# Patient Record
Sex: Male | Born: 1935
Health system: Southern US, Community
[De-identification: ages and names within clinical notes are randomized; demographics above are authoritative.]

---

## 2009-06-14 ENCOUNTER — Encounter: Admission: RE | Admit: 2009-06-14 | Discharge: 2009-06-14 | Payer: Self-pay | Admitting: Unknown Physician Specialty

## 2010-10-21 IMAGING — CR DG KNEE COMPLETE 4+V*L*
4 series · 4 of 4 positions shown · non-contrast
Comparison: None.

CLINICAL DATA: Recent fall with left knee pain.

LEFT KNEE - COMPLETE 4+ VIEW

[view not recorded (1 of 4)]
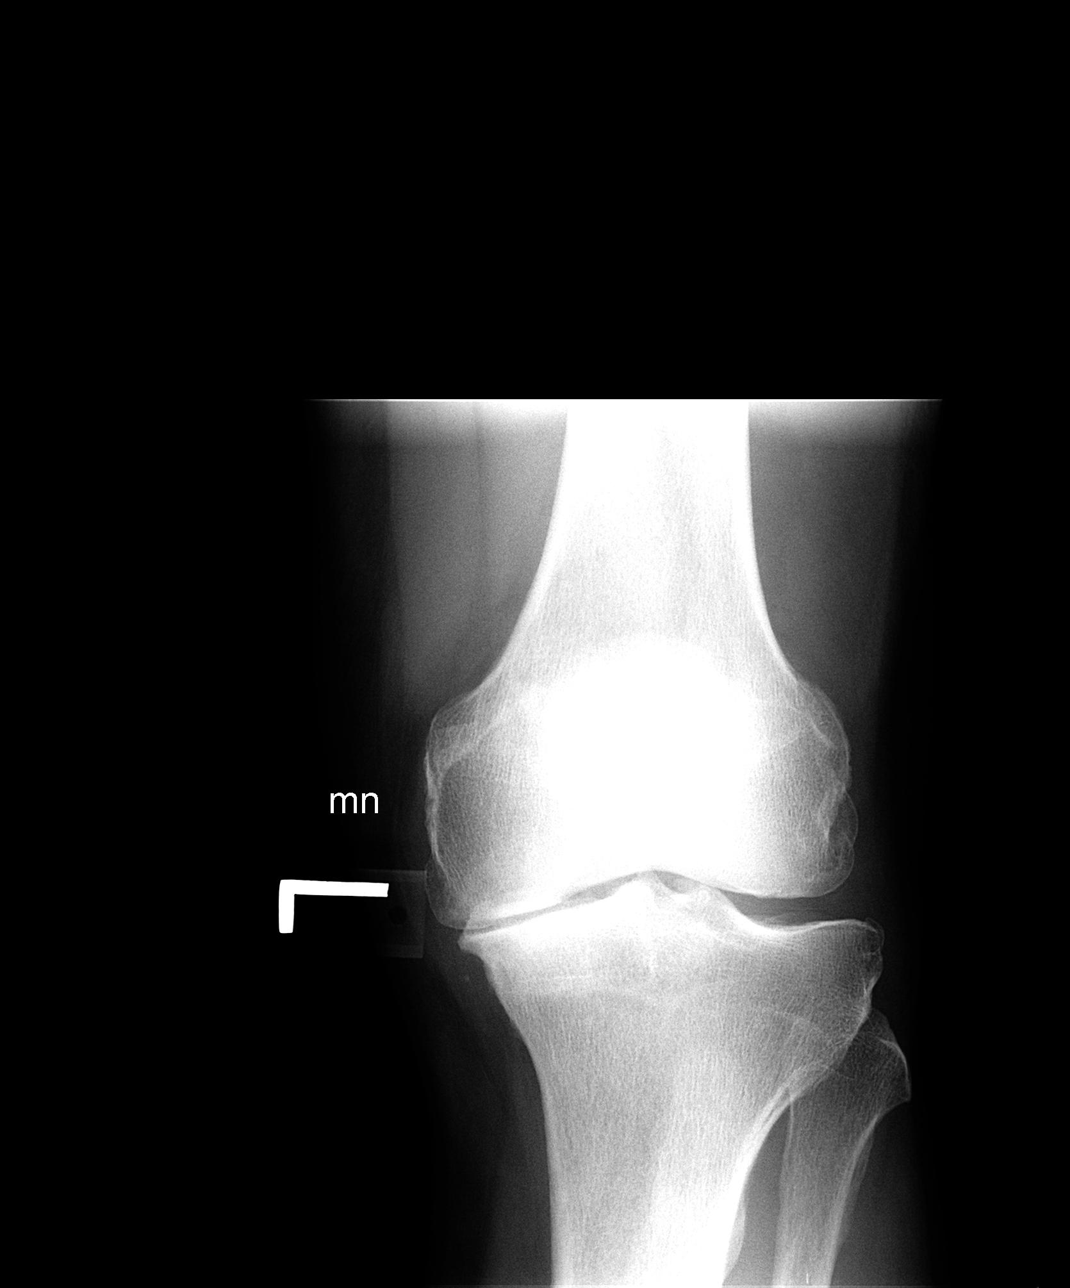

[view not recorded (2 of 4)]
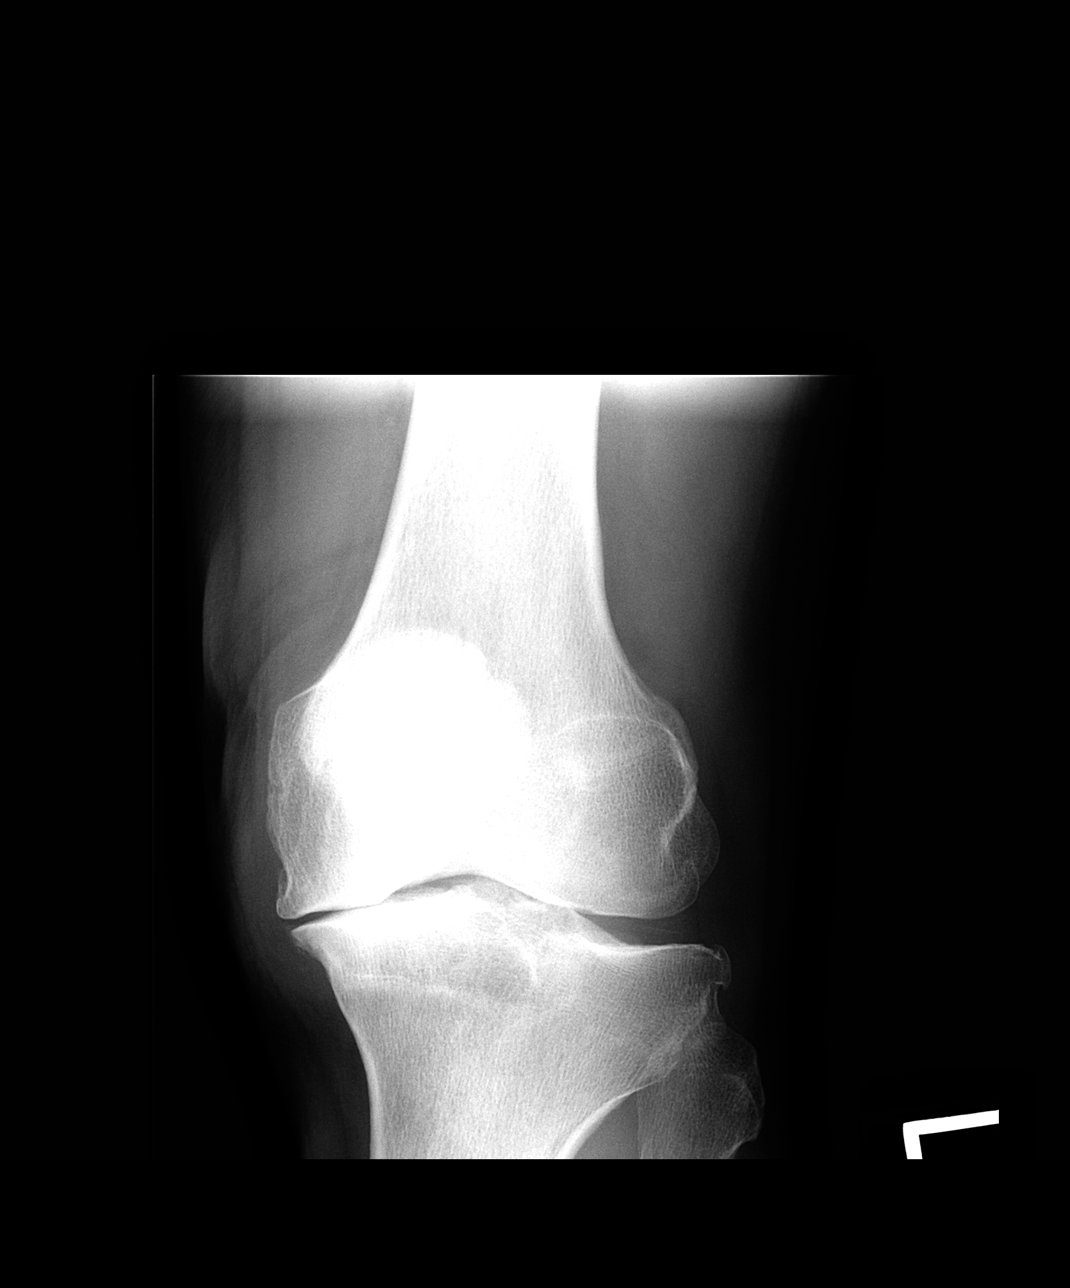

[view not recorded (3 of 4)]
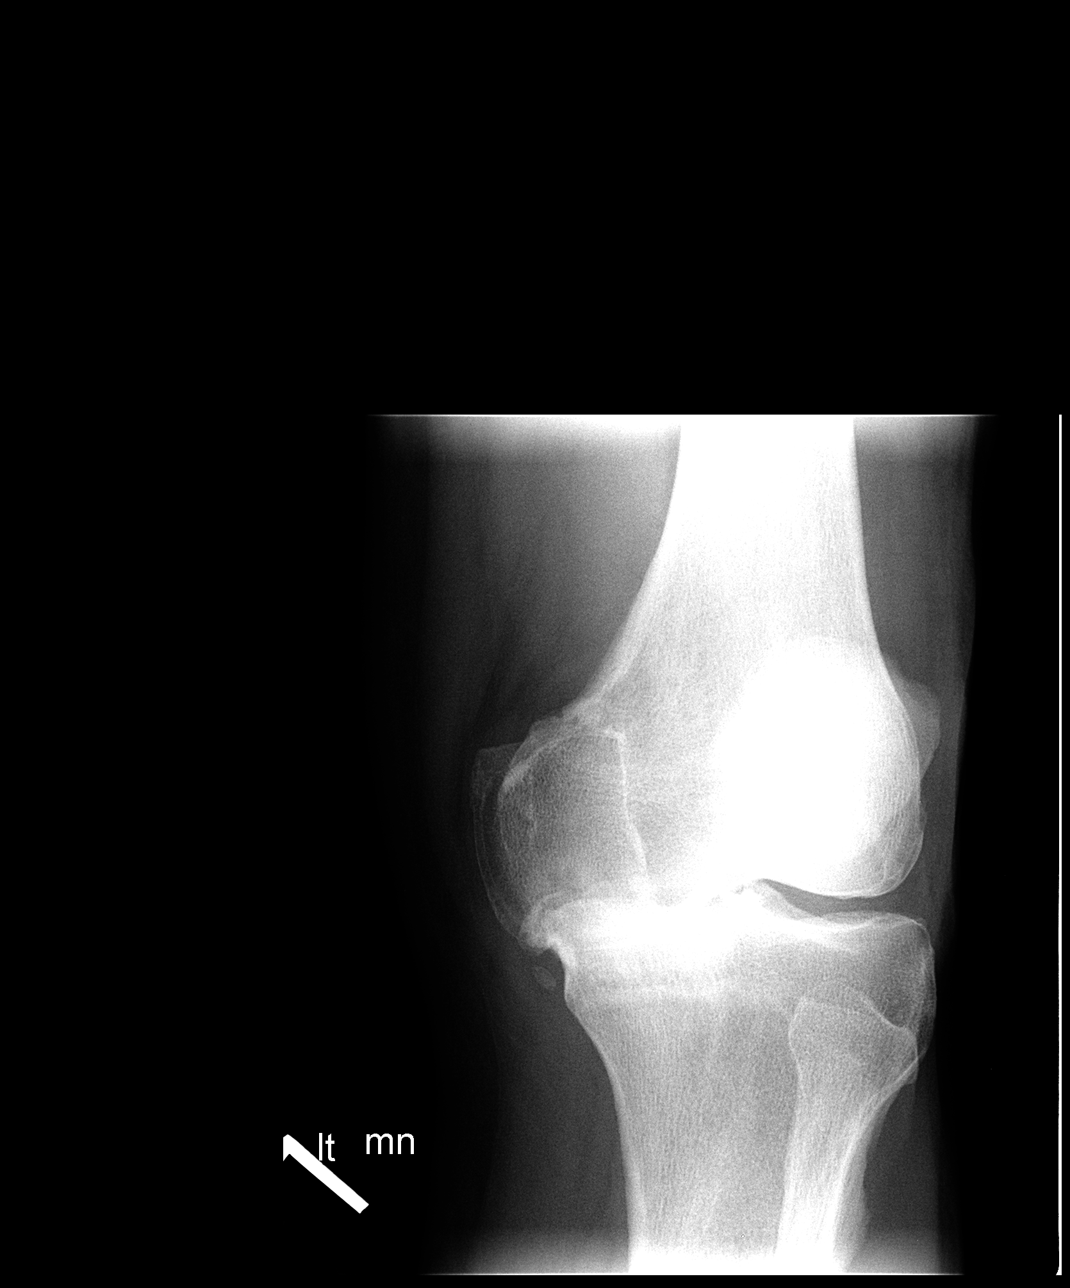

[view not recorded (4 of 4)]
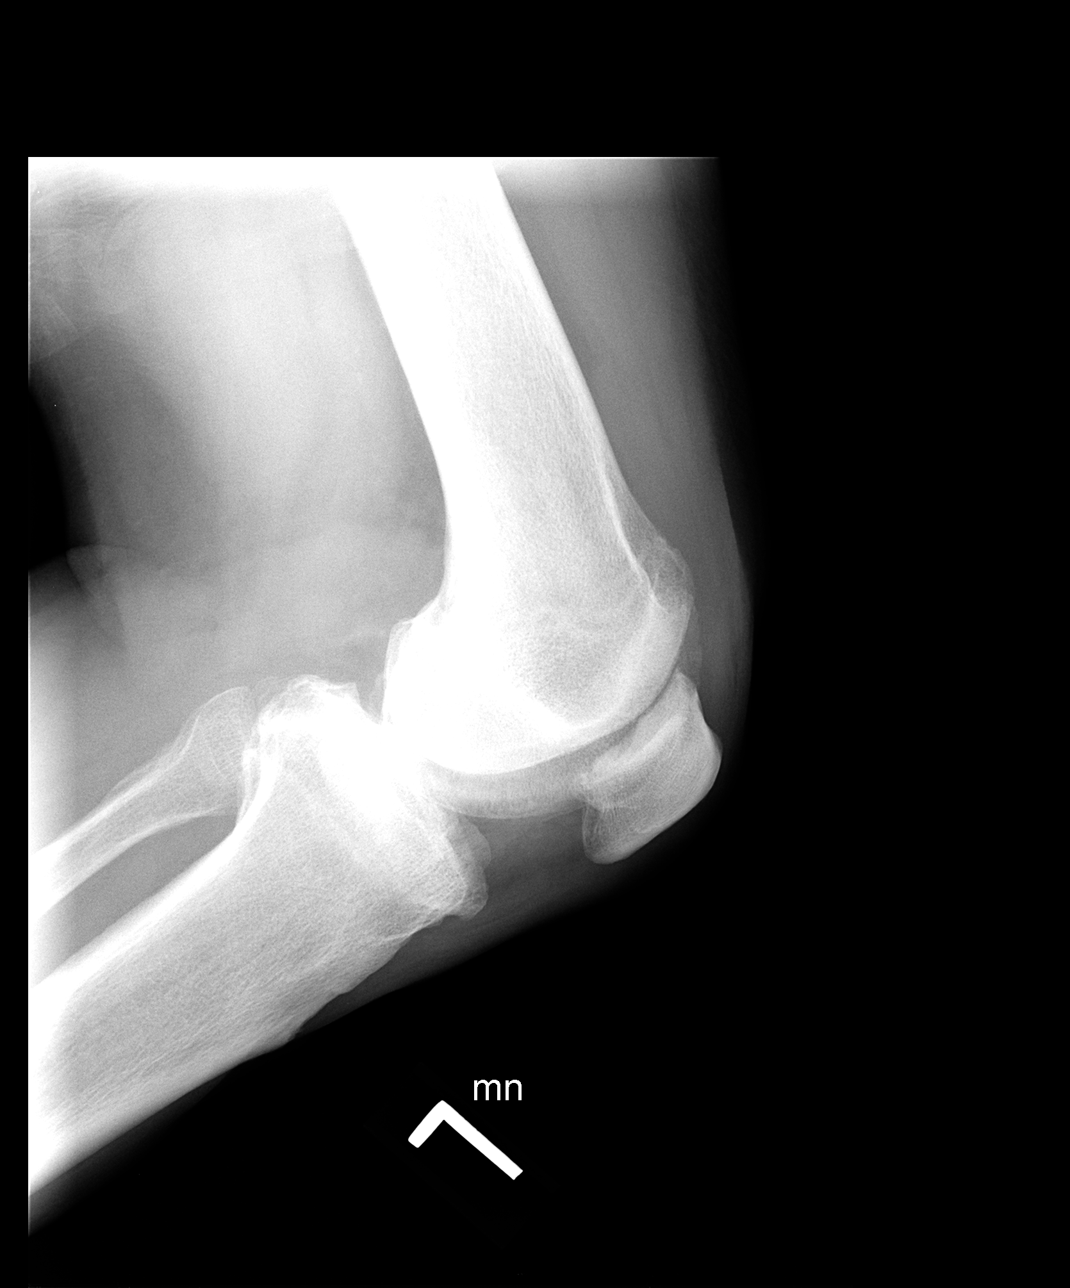

[4 of 4 positions shown; findings below may reference images not displayed]

FINDINGS: Difficult to exclude a small joint effusion.  No
fracture.  Marked medial joint space narrowing and subchondral
sclerosis.  Tricompartment osteophytosis. Chondrocalcinosis is seen
in the lateral compartment.  There is mild lateral subluxation of
the tibia, with respect to the distal femur.
IMPRESSION: 1.  Cannot exclude a small joint effusion.  No fracture.
2.  Osteoarthritis is worst in the medial compartment.

## 2015-04-11 DIAGNOSIS — R972 Elevated prostate specific antigen [PSA]: Secondary | ICD-10-CM | POA: Diagnosis not present

## 2015-04-13 DIAGNOSIS — I7300 Raynaud's syndrome without gangrene: Secondary | ICD-10-CM | POA: Diagnosis not present

## 2015-04-13 DIAGNOSIS — E7800 Pure hypercholesterolemia, unspecified: Secondary | ICD-10-CM | POA: Diagnosis not present

## 2015-04-13 DIAGNOSIS — K219 Gastro-esophageal reflux disease without esophagitis: Secondary | ICD-10-CM | POA: Diagnosis not present

## 2015-04-13 DIAGNOSIS — B0229 Other postherpetic nervous system involvement: Secondary | ICD-10-CM | POA: Diagnosis not present

## 2015-04-13 DIAGNOSIS — M170 Bilateral primary osteoarthritis of knee: Secondary | ICD-10-CM | POA: Diagnosis not present

## 2015-04-13 DIAGNOSIS — I10 Essential (primary) hypertension: Secondary | ICD-10-CM | POA: Diagnosis not present

## 2015-04-19 DIAGNOSIS — R972 Elevated prostate specific antigen [PSA]: Secondary | ICD-10-CM | POA: Diagnosis not present

## 2015-04-19 DIAGNOSIS — Z87448 Personal history of other diseases of urinary system: Secondary | ICD-10-CM | POA: Diagnosis not present

## 2015-04-19 DIAGNOSIS — C9110 Chronic lymphocytic leukemia of B-cell type not having achieved remission: Secondary | ICD-10-CM | POA: Diagnosis not present

## 2015-05-24 DIAGNOSIS — R1084 Generalized abdominal pain: Secondary | ICD-10-CM | POA: Diagnosis not present

## 2015-05-24 DIAGNOSIS — K219 Gastro-esophageal reflux disease without esophagitis: Secondary | ICD-10-CM | POA: Diagnosis not present

## 2015-05-24 DIAGNOSIS — R112 Nausea with vomiting, unspecified: Secondary | ICD-10-CM | POA: Diagnosis not present

## 2015-05-24 DIAGNOSIS — R1013 Epigastric pain: Secondary | ICD-10-CM | POA: Diagnosis not present

## 2015-05-24 DIAGNOSIS — E739 Lactose intolerance, unspecified: Secondary | ICD-10-CM | POA: Diagnosis not present

## 2015-06-15 DIAGNOSIS — H9190 Unspecified hearing loss, unspecified ear: Secondary | ICD-10-CM | POA: Diagnosis not present

## 2015-06-15 DIAGNOSIS — H9319 Tinnitus, unspecified ear: Secondary | ICD-10-CM | POA: Diagnosis not present

## 2015-06-15 DIAGNOSIS — R972 Elevated prostate specific antigen [PSA]: Secondary | ICD-10-CM | POA: Diagnosis not present

## 2015-06-15 DIAGNOSIS — C9110 Chronic lymphocytic leukemia of B-cell type not having achieved remission: Secondary | ICD-10-CM | POA: Diagnosis not present

## 2015-08-16 DIAGNOSIS — E739 Lactose intolerance, unspecified: Secondary | ICD-10-CM | POA: Diagnosis not present

## 2015-08-16 DIAGNOSIS — K219 Gastro-esophageal reflux disease without esophagitis: Secondary | ICD-10-CM | POA: Diagnosis not present

## 2015-08-16 DIAGNOSIS — R194 Change in bowel habit: Secondary | ICD-10-CM | POA: Diagnosis not present

## 2015-08-16 DIAGNOSIS — K589 Irritable bowel syndrome without diarrhea: Secondary | ICD-10-CM | POA: Diagnosis not present

## 2015-10-18 DIAGNOSIS — E7800 Pure hypercholesterolemia, unspecified: Secondary | ICD-10-CM | POA: Diagnosis not present

## 2015-10-18 DIAGNOSIS — Z0000 Encounter for general adult medical examination without abnormal findings: Secondary | ICD-10-CM | POA: Diagnosis not present

## 2015-10-18 DIAGNOSIS — Z23 Encounter for immunization: Secondary | ICD-10-CM | POA: Diagnosis not present

## 2015-10-18 DIAGNOSIS — I10 Essential (primary) hypertension: Secondary | ICD-10-CM | POA: Diagnosis not present

## 2015-10-18 DIAGNOSIS — C9110 Chronic lymphocytic leukemia of B-cell type not having achieved remission: Secondary | ICD-10-CM | POA: Diagnosis not present

## 2015-10-18 DIAGNOSIS — M170 Bilateral primary osteoarthritis of knee: Secondary | ICD-10-CM | POA: Diagnosis not present

## 2015-11-30 DIAGNOSIS — Z23 Encounter for immunization: Secondary | ICD-10-CM | POA: Diagnosis not present

## 2016-03-15 DIAGNOSIS — I10 Essential (primary) hypertension: Secondary | ICD-10-CM | POA: Diagnosis not present

## 2016-03-15 DIAGNOSIS — R972 Elevated prostate specific antigen [PSA]: Secondary | ICD-10-CM | POA: Diagnosis not present

## 2016-03-15 DIAGNOSIS — N400 Enlarged prostate without lower urinary tract symptoms: Secondary | ICD-10-CM | POA: Diagnosis not present

## 2016-03-15 DIAGNOSIS — C9110 Chronic lymphocytic leukemia of B-cell type not having achieved remission: Secondary | ICD-10-CM | POA: Diagnosis not present

## 2016-03-15 DIAGNOSIS — C9190 Lymphoid leukemia, unspecified not having achieved remission: Secondary | ICD-10-CM | POA: Diagnosis not present

## 2016-03-15 DIAGNOSIS — K219 Gastro-esophageal reflux disease without esophagitis: Secondary | ICD-10-CM | POA: Diagnosis not present

## 2016-05-27 DIAGNOSIS — I10 Essential (primary) hypertension: Secondary | ICD-10-CM | POA: Diagnosis not present

## 2016-05-27 DIAGNOSIS — E7800 Pure hypercholesterolemia, unspecified: Secondary | ICD-10-CM | POA: Diagnosis not present

## 2016-05-27 DIAGNOSIS — K219 Gastro-esophageal reflux disease without esophagitis: Secondary | ICD-10-CM | POA: Diagnosis not present

## 2016-05-27 DIAGNOSIS — N400 Enlarged prostate without lower urinary tract symptoms: Secondary | ICD-10-CM | POA: Diagnosis not present

## 2016-06-06 DIAGNOSIS — Z856 Personal history of leukemia: Secondary | ICD-10-CM | POA: Diagnosis not present

## 2016-06-06 DIAGNOSIS — Z885 Allergy status to narcotic agent status: Secondary | ICD-10-CM | POA: Diagnosis not present

## 2016-06-06 DIAGNOSIS — K6289 Other specified diseases of anus and rectum: Secondary | ICD-10-CM | POA: Diagnosis not present

## 2016-06-06 DIAGNOSIS — Z79899 Other long term (current) drug therapy: Secondary | ICD-10-CM | POA: Diagnosis not present

## 2016-06-06 DIAGNOSIS — R1084 Generalized abdominal pain: Secondary | ICD-10-CM | POA: Diagnosis not present

## 2016-06-06 DIAGNOSIS — R55 Syncope and collapse: Secondary | ICD-10-CM | POA: Diagnosis not present

## 2016-06-06 DIAGNOSIS — R197 Diarrhea, unspecified: Secondary | ICD-10-CM | POA: Diagnosis not present

## 2016-06-06 DIAGNOSIS — Z87891 Personal history of nicotine dependence: Secondary | ICD-10-CM | POA: Diagnosis not present

## 2016-06-06 DIAGNOSIS — N400 Enlarged prostate without lower urinary tract symptoms: Secondary | ICD-10-CM | POA: Diagnosis not present

## 2016-08-07 DIAGNOSIS — K219 Gastro-esophageal reflux disease without esophagitis: Secondary | ICD-10-CM | POA: Diagnosis not present

## 2016-08-07 DIAGNOSIS — I10 Essential (primary) hypertension: Secondary | ICD-10-CM | POA: Diagnosis not present

## 2016-09-30 DIAGNOSIS — I10 Essential (primary) hypertension: Secondary | ICD-10-CM | POA: Diagnosis not present

## 2016-09-30 DIAGNOSIS — N401 Enlarged prostate with lower urinary tract symptoms: Secondary | ICD-10-CM | POA: Diagnosis not present

## 2016-09-30 DIAGNOSIS — C9190 Lymphoid leukemia, unspecified not having achieved remission: Secondary | ICD-10-CM | POA: Diagnosis not present

## 2016-09-30 DIAGNOSIS — R972 Elevated prostate specific antigen [PSA]: Secondary | ICD-10-CM | POA: Diagnosis not present

## 2016-10-21 DIAGNOSIS — C9110 Chronic lymphocytic leukemia of B-cell type not having achieved remission: Secondary | ICD-10-CM | POA: Diagnosis not present

## 2016-10-21 DIAGNOSIS — M170 Bilateral primary osteoarthritis of knee: Secondary | ICD-10-CM | POA: Diagnosis not present

## 2016-10-21 DIAGNOSIS — I700 Atherosclerosis of aorta: Secondary | ICD-10-CM | POA: Diagnosis not present

## 2016-10-21 DIAGNOSIS — E7800 Pure hypercholesterolemia, unspecified: Secondary | ICD-10-CM | POA: Diagnosis not present

## 2016-10-21 DIAGNOSIS — I10 Essential (primary) hypertension: Secondary | ICD-10-CM | POA: Diagnosis not present

## 2016-10-21 DIAGNOSIS — Z0000 Encounter for general adult medical examination without abnormal findings: Secondary | ICD-10-CM | POA: Diagnosis not present

## 2016-12-05 DIAGNOSIS — K219 Gastro-esophageal reflux disease without esophagitis: Secondary | ICD-10-CM | POA: Diagnosis not present

## 2016-12-05 DIAGNOSIS — Z9189 Other specified personal risk factors, not elsewhere classified: Secondary | ICD-10-CM | POA: Diagnosis not present

## 2016-12-05 DIAGNOSIS — I10 Essential (primary) hypertension: Secondary | ICD-10-CM | POA: Diagnosis not present

## 2016-12-05 DIAGNOSIS — L989 Disorder of the skin and subcutaneous tissue, unspecified: Secondary | ICD-10-CM | POA: Diagnosis not present

## 2016-12-05 DIAGNOSIS — C9190 Lymphoid leukemia, unspecified not having achieved remission: Secondary | ICD-10-CM | POA: Diagnosis not present

## 2016-12-31 DIAGNOSIS — Z23 Encounter for immunization: Secondary | ICD-10-CM | POA: Diagnosis not present

## 2017-01-22 DIAGNOSIS — L821 Other seborrheic keratosis: Secondary | ICD-10-CM | POA: Diagnosis not present

## 2017-04-30 DIAGNOSIS — M170 Bilateral primary osteoarthritis of knee: Secondary | ICD-10-CM | POA: Diagnosis not present

## 2017-04-30 DIAGNOSIS — K219 Gastro-esophageal reflux disease without esophagitis: Secondary | ICD-10-CM | POA: Diagnosis not present

## 2017-04-30 DIAGNOSIS — E7800 Pure hypercholesterolemia, unspecified: Secondary | ICD-10-CM | POA: Diagnosis not present

## 2017-04-30 DIAGNOSIS — I10 Essential (primary) hypertension: Secondary | ICD-10-CM | POA: Diagnosis not present

## 2017-06-12 DIAGNOSIS — M2550 Pain in unspecified joint: Secondary | ICD-10-CM | POA: Diagnosis not present

## 2017-06-12 DIAGNOSIS — I10 Essential (primary) hypertension: Secondary | ICD-10-CM | POA: Diagnosis not present

## 2017-06-12 DIAGNOSIS — R531 Weakness: Secondary | ICD-10-CM | POA: Diagnosis not present

## 2017-06-12 DIAGNOSIS — H9319 Tinnitus, unspecified ear: Secondary | ICD-10-CM | POA: Diagnosis not present

## 2017-06-12 DIAGNOSIS — C9110 Chronic lymphocytic leukemia of B-cell type not having achieved remission: Secondary | ICD-10-CM | POA: Diagnosis not present

## 2017-06-12 DIAGNOSIS — K219 Gastro-esophageal reflux disease without esophagitis: Secondary | ICD-10-CM | POA: Diagnosis not present

## 2017-06-12 DIAGNOSIS — D849 Immunodeficiency, unspecified: Secondary | ICD-10-CM | POA: Diagnosis not present

## 2017-06-12 DIAGNOSIS — Z9189 Other specified personal risk factors, not elsewhere classified: Secondary | ICD-10-CM | POA: Diagnosis not present

## 2017-06-12 DIAGNOSIS — C9190 Lymphoid leukemia, unspecified not having achieved remission: Secondary | ICD-10-CM | POA: Diagnosis not present

## 2017-08-01 DIAGNOSIS — R490 Dysphonia: Secondary | ICD-10-CM | POA: Diagnosis not present

## 2017-08-01 DIAGNOSIS — R070 Pain in throat: Secondary | ICD-10-CM | POA: Diagnosis not present

## 2017-08-01 DIAGNOSIS — J705 Respiratory conditions due to smoke inhalation: Secondary | ICD-10-CM | POA: Diagnosis not present

## 2017-08-06 DIAGNOSIS — I10 Essential (primary) hypertension: Secondary | ICD-10-CM | POA: Diagnosis not present

## 2017-08-06 DIAGNOSIS — K219 Gastro-esophageal reflux disease without esophagitis: Secondary | ICD-10-CM | POA: Diagnosis not present

## 2017-11-11 DIAGNOSIS — Z23 Encounter for immunization: Secondary | ICD-10-CM | POA: Diagnosis not present

## 2017-11-18 DIAGNOSIS — I10 Essential (primary) hypertension: Secondary | ICD-10-CM | POA: Diagnosis not present

## 2017-11-25 DIAGNOSIS — M170 Bilateral primary osteoarthritis of knee: Secondary | ICD-10-CM | POA: Diagnosis not present

## 2017-11-25 DIAGNOSIS — E7800 Pure hypercholesterolemia, unspecified: Secondary | ICD-10-CM | POA: Diagnosis not present

## 2017-11-25 DIAGNOSIS — K219 Gastro-esophageal reflux disease without esophagitis: Secondary | ICD-10-CM | POA: Diagnosis not present

## 2017-11-25 DIAGNOSIS — Z0000 Encounter for general adult medical examination without abnormal findings: Secondary | ICD-10-CM | POA: Diagnosis not present

## 2017-11-25 DIAGNOSIS — I10 Essential (primary) hypertension: Secondary | ICD-10-CM | POA: Diagnosis not present

## 2017-11-28 DIAGNOSIS — I10 Essential (primary) hypertension: Secondary | ICD-10-CM | POA: Diagnosis not present

## 2017-11-28 DIAGNOSIS — E7800 Pure hypercholesterolemia, unspecified: Secondary | ICD-10-CM | POA: Diagnosis not present

## 2017-12-22 DIAGNOSIS — C9110 Chronic lymphocytic leukemia of B-cell type not having achieved remission: Secondary | ICD-10-CM | POA: Diagnosis not present

## 2017-12-22 DIAGNOSIS — I10 Essential (primary) hypertension: Secondary | ICD-10-CM | POA: Diagnosis not present

## 2017-12-22 DIAGNOSIS — Z9189 Other specified personal risk factors, not elsewhere classified: Secondary | ICD-10-CM | POA: Diagnosis not present

## 2018-04-06 DIAGNOSIS — L02222 Furuncle of back [any part, except buttock]: Secondary | ICD-10-CM | POA: Diagnosis not present

## 2018-04-23 DIAGNOSIS — L02222 Furuncle of back [any part, except buttock]: Secondary | ICD-10-CM | POA: Diagnosis not present

## 2018-05-02 DIAGNOSIS — I6521 Occlusion and stenosis of right carotid artery: Secondary | ICD-10-CM | POA: Diagnosis not present

## 2018-05-02 DIAGNOSIS — Z9849 Cataract extraction status, unspecified eye: Secondary | ICD-10-CM | POA: Diagnosis not present

## 2018-05-02 DIAGNOSIS — M1990 Unspecified osteoarthritis, unspecified site: Secondary | ICD-10-CM | POA: Diagnosis not present

## 2018-05-02 DIAGNOSIS — I10 Essential (primary) hypertension: Secondary | ICD-10-CM | POA: Diagnosis not present

## 2018-05-02 DIAGNOSIS — I358 Other nonrheumatic aortic valve disorders: Secondary | ICD-10-CM | POA: Diagnosis not present

## 2018-05-02 DIAGNOSIS — I6501 Occlusion and stenosis of right vertebral artery: Secondary | ICD-10-CM | POA: Diagnosis not present

## 2018-05-02 DIAGNOSIS — I6381 Other cerebral infarction due to occlusion or stenosis of small artery: Secondary | ICD-10-CM | POA: Diagnosis not present

## 2018-05-02 DIAGNOSIS — K219 Gastro-esophageal reflux disease without esophagitis: Secondary | ICD-10-CM | POA: Diagnosis not present

## 2018-05-02 DIAGNOSIS — Z87891 Personal history of nicotine dependence: Secondary | ICD-10-CM | POA: Diagnosis not present

## 2018-05-02 DIAGNOSIS — Z79899 Other long term (current) drug therapy: Secondary | ICD-10-CM | POA: Diagnosis not present

## 2018-05-02 DIAGNOSIS — I639 Cerebral infarction, unspecified: Secondary | ICD-10-CM | POA: Diagnosis not present

## 2018-05-02 DIAGNOSIS — I674 Hypertensive encephalopathy: Secondary | ICD-10-CM | POA: Diagnosis not present

## 2018-05-02 DIAGNOSIS — Z96651 Presence of right artificial knee joint: Secondary | ICD-10-CM | POA: Diagnosis not present

## 2018-05-02 DIAGNOSIS — I519 Heart disease, unspecified: Secondary | ICD-10-CM | POA: Diagnosis not present

## 2018-05-02 DIAGNOSIS — C9110 Chronic lymphocytic leukemia of B-cell type not having achieved remission: Secondary | ICD-10-CM | POA: Diagnosis not present

## 2018-05-02 DIAGNOSIS — I517 Cardiomegaly: Secondary | ICD-10-CM | POA: Diagnosis not present

## 2018-05-02 DIAGNOSIS — Z888 Allergy status to other drugs, medicaments and biological substances status: Secondary | ICD-10-CM | POA: Diagnosis not present

## 2018-05-02 DIAGNOSIS — Z885 Allergy status to narcotic agent status: Secondary | ICD-10-CM | POA: Diagnosis not present

## 2018-05-02 DIAGNOSIS — R471 Dysarthria and anarthria: Secondary | ICD-10-CM | POA: Diagnosis not present

## 2018-05-06 DIAGNOSIS — R4781 Slurred speech: Secondary | ICD-10-CM | POA: Diagnosis not present

## 2018-05-06 DIAGNOSIS — I69922 Dysarthria following unspecified cerebrovascular disease: Secondary | ICD-10-CM | POA: Diagnosis not present

## 2018-05-06 DIAGNOSIS — I69928 Other speech and language deficits following unspecified cerebrovascular disease: Secondary | ICD-10-CM | POA: Diagnosis not present

## 2018-05-06 DIAGNOSIS — G319 Degenerative disease of nervous system, unspecified: Secondary | ICD-10-CM | POA: Diagnosis not present

## 2018-05-06 DIAGNOSIS — M1990 Unspecified osteoarthritis, unspecified site: Secondary | ICD-10-CM | POA: Diagnosis not present

## 2018-05-07 DIAGNOSIS — Z09 Encounter for follow-up examination after completed treatment for conditions other than malignant neoplasm: Secondary | ICD-10-CM | POA: Diagnosis not present

## 2018-05-07 DIAGNOSIS — I10 Essential (primary) hypertension: Secondary | ICD-10-CM | POA: Diagnosis not present

## 2018-05-07 DIAGNOSIS — I6381 Other cerebral infarction due to occlusion or stenosis of small artery: Secondary | ICD-10-CM | POA: Diagnosis not present

## 2018-05-07 DIAGNOSIS — E7800 Pure hypercholesterolemia, unspecified: Secondary | ICD-10-CM | POA: Diagnosis not present

## 2018-05-07 DIAGNOSIS — R471 Dysarthria and anarthria: Secondary | ICD-10-CM | POA: Diagnosis not present

## 2018-05-13 DIAGNOSIS — I69928 Other speech and language deficits following unspecified cerebrovascular disease: Secondary | ICD-10-CM | POA: Diagnosis not present

## 2018-05-13 DIAGNOSIS — R4781 Slurred speech: Secondary | ICD-10-CM | POA: Diagnosis not present

## 2018-05-13 DIAGNOSIS — I6381 Other cerebral infarction due to occlusion or stenosis of small artery: Secondary | ICD-10-CM | POA: Diagnosis not present

## 2018-05-13 DIAGNOSIS — G319 Degenerative disease of nervous system, unspecified: Secondary | ICD-10-CM | POA: Diagnosis not present

## 2018-07-22 DIAGNOSIS — I10 Essential (primary) hypertension: Secondary | ICD-10-CM | POA: Diagnosis not present

## 2018-07-22 DIAGNOSIS — Z8673 Personal history of transient ischemic attack (TIA), and cerebral infarction without residual deficits: Secondary | ICD-10-CM | POA: Diagnosis not present

## 2018-08-24 DIAGNOSIS — K219 Gastro-esophageal reflux disease without esophagitis: Secondary | ICD-10-CM | POA: Diagnosis not present

## 2018-08-24 DIAGNOSIS — M170 Bilateral primary osteoarthritis of knee: Secondary | ICD-10-CM | POA: Diagnosis not present

## 2018-08-24 DIAGNOSIS — E7800 Pure hypercholesterolemia, unspecified: Secondary | ICD-10-CM | POA: Diagnosis not present

## 2018-08-24 DIAGNOSIS — I10 Essential (primary) hypertension: Secondary | ICD-10-CM | POA: Diagnosis not present

## 2018-09-15 DIAGNOSIS — B027 Disseminated zoster: Secondary | ICD-10-CM | POA: Diagnosis not present

## 2018-09-15 DIAGNOSIS — I10 Essential (primary) hypertension: Secondary | ICD-10-CM | POA: Diagnosis not present

## 2018-09-21 DIAGNOSIS — I10 Essential (primary) hypertension: Secondary | ICD-10-CM | POA: Diagnosis not present

## 2018-09-21 DIAGNOSIS — M792 Neuralgia and neuritis, unspecified: Secondary | ICD-10-CM | POA: Diagnosis not present

## 2018-09-21 DIAGNOSIS — B029 Zoster without complications: Secondary | ICD-10-CM | POA: Diagnosis not present

## 2018-11-18 DIAGNOSIS — I10 Essential (primary) hypertension: Secondary | ICD-10-CM | POA: Diagnosis not present

## 2018-11-18 DIAGNOSIS — K219 Gastro-esophageal reflux disease without esophagitis: Secondary | ICD-10-CM | POA: Diagnosis not present

## 2019-05-13 DIAGNOSIS — I10 Essential (primary) hypertension: Secondary | ICD-10-CM | POA: Diagnosis not present

## 2019-05-13 DIAGNOSIS — C9110 Chronic lymphocytic leukemia of B-cell type not having achieved remission: Secondary | ICD-10-CM | POA: Diagnosis not present

## 2019-05-13 DIAGNOSIS — I6930 Unspecified sequelae of cerebral infarction: Secondary | ICD-10-CM | POA: Diagnosis not present

## 2019-06-02 DIAGNOSIS — N210 Calculus in bladder: Secondary | ICD-10-CM | POA: Diagnosis not present

## 2019-06-03 DIAGNOSIS — Z79899 Other long term (current) drug therapy: Secondary | ICD-10-CM | POA: Diagnosis not present

## 2019-06-03 DIAGNOSIS — I700 Atherosclerosis of aorta: Secondary | ICD-10-CM | POA: Diagnosis not present

## 2019-06-03 DIAGNOSIS — E278 Other specified disorders of adrenal gland: Secondary | ICD-10-CM | POA: Diagnosis not present

## 2019-06-03 DIAGNOSIS — Z87891 Personal history of nicotine dependence: Secondary | ICD-10-CM | POA: Diagnosis not present

## 2019-06-03 DIAGNOSIS — N281 Cyst of kidney, acquired: Secondary | ICD-10-CM | POA: Diagnosis not present

## 2019-06-03 DIAGNOSIS — Z888 Allergy status to other drugs, medicaments and biological substances status: Secondary | ICD-10-CM | POA: Diagnosis not present

## 2019-06-03 DIAGNOSIS — N201 Calculus of ureter: Secondary | ICD-10-CM | POA: Diagnosis not present

## 2019-06-03 DIAGNOSIS — I10 Essential (primary) hypertension: Secondary | ICD-10-CM | POA: Diagnosis not present

## 2019-06-03 DIAGNOSIS — R112 Nausea with vomiting, unspecified: Secondary | ICD-10-CM | POA: Diagnosis not present

## 2019-06-03 DIAGNOSIS — K449 Diaphragmatic hernia without obstruction or gangrene: Secondary | ICD-10-CM | POA: Diagnosis not present

## 2019-06-03 DIAGNOSIS — R1031 Right lower quadrant pain: Secondary | ICD-10-CM | POA: Diagnosis not present

## 2019-06-03 DIAGNOSIS — Z886 Allergy status to analgesic agent status: Secondary | ICD-10-CM | POA: Diagnosis not present

## 2019-06-04 DIAGNOSIS — I129 Hypertensive chronic kidney disease with stage 1 through stage 4 chronic kidney disease, or unspecified chronic kidney disease: Secondary | ICD-10-CM | POA: Diagnosis not present

## 2019-06-04 DIAGNOSIS — E785 Hyperlipidemia, unspecified: Secondary | ICD-10-CM | POA: Diagnosis not present

## 2019-06-04 DIAGNOSIS — N138 Other obstructive and reflux uropathy: Secondary | ICD-10-CM | POA: Diagnosis not present

## 2019-06-04 DIAGNOSIS — N200 Calculus of kidney: Secondary | ICD-10-CM | POA: Diagnosis not present

## 2019-06-04 DIAGNOSIS — R7989 Other specified abnormal findings of blood chemistry: Secondary | ICD-10-CM | POA: Diagnosis not present

## 2019-06-04 DIAGNOSIS — C9110 Chronic lymphocytic leukemia of B-cell type not having achieved remission: Secondary | ICD-10-CM | POA: Diagnosis not present

## 2019-06-04 DIAGNOSIS — M1990 Unspecified osteoarthritis, unspecified site: Secondary | ICD-10-CM | POA: Diagnosis not present

## 2019-06-04 DIAGNOSIS — Z87891 Personal history of nicotine dependence: Secondary | ICD-10-CM | POA: Diagnosis not present

## 2019-06-04 DIAGNOSIS — R590 Localized enlarged lymph nodes: Secondary | ICD-10-CM | POA: Diagnosis not present

## 2019-06-04 DIAGNOSIS — N3289 Other specified disorders of bladder: Secondary | ICD-10-CM | POA: Diagnosis not present

## 2019-06-04 DIAGNOSIS — N210 Calculus in bladder: Secondary | ICD-10-CM | POA: Diagnosis not present

## 2019-06-04 DIAGNOSIS — K5790 Diverticulosis of intestine, part unspecified, without perforation or abscess without bleeding: Secondary | ICD-10-CM | POA: Diagnosis not present

## 2019-06-04 DIAGNOSIS — Z888 Allergy status to other drugs, medicaments and biological substances status: Secondary | ICD-10-CM | POA: Diagnosis not present

## 2019-06-04 DIAGNOSIS — I10 Essential (primary) hypertension: Secondary | ICD-10-CM | POA: Diagnosis not present

## 2019-06-04 DIAGNOSIS — R319 Hematuria, unspecified: Secondary | ICD-10-CM | POA: Diagnosis not present

## 2019-06-04 DIAGNOSIS — Z8249 Family history of ischemic heart disease and other diseases of the circulatory system: Secondary | ICD-10-CM | POA: Diagnosis not present

## 2019-06-04 DIAGNOSIS — N179 Acute kidney failure, unspecified: Secondary | ICD-10-CM | POA: Diagnosis not present

## 2019-06-04 DIAGNOSIS — Z20822 Contact with and (suspected) exposure to covid-19: Secondary | ICD-10-CM | POA: Diagnosis not present

## 2019-06-04 DIAGNOSIS — Z8673 Personal history of transient ischemic attack (TIA), and cerebral infarction without residual deficits: Secondary | ICD-10-CM | POA: Diagnosis not present

## 2019-06-04 DIAGNOSIS — Z960 Presence of urogenital implants: Secondary | ICD-10-CM | POA: Diagnosis not present

## 2019-06-04 DIAGNOSIS — Z885 Allergy status to narcotic agent status: Secondary | ICD-10-CM | POA: Diagnosis not present

## 2019-06-04 DIAGNOSIS — Z886 Allergy status to analgesic agent status: Secondary | ICD-10-CM | POA: Diagnosis not present

## 2019-06-04 DIAGNOSIS — Z7902 Long term (current) use of antithrombotics/antiplatelets: Secondary | ICD-10-CM | POA: Diagnosis not present

## 2019-06-04 DIAGNOSIS — Z87442 Personal history of urinary calculi: Secondary | ICD-10-CM | POA: Diagnosis not present

## 2019-06-04 DIAGNOSIS — N201 Calculus of ureter: Secondary | ICD-10-CM | POA: Diagnosis not present

## 2019-06-04 DIAGNOSIS — N183 Chronic kidney disease, stage 3 unspecified: Secondary | ICD-10-CM | POA: Diagnosis not present

## 2019-06-04 DIAGNOSIS — Z803 Family history of malignant neoplasm of breast: Secondary | ICD-10-CM | POA: Diagnosis not present

## 2019-06-04 DIAGNOSIS — Z96651 Presence of right artificial knee joint: Secondary | ICD-10-CM | POA: Diagnosis not present

## 2019-06-04 DIAGNOSIS — I69328 Other speech and language deficits following cerebral infarction: Secondary | ICD-10-CM | POA: Diagnosis not present

## 2019-06-04 DIAGNOSIS — K219 Gastro-esophageal reflux disease without esophagitis: Secondary | ICD-10-CM | POA: Diagnosis not present

## 2019-06-04 DIAGNOSIS — E7800 Pure hypercholesterolemia, unspecified: Secondary | ICD-10-CM | POA: Diagnosis not present

## 2019-06-04 DIAGNOSIS — I69351 Hemiplegia and hemiparesis following cerebral infarction affecting right dominant side: Secondary | ICD-10-CM | POA: Diagnosis not present

## 2019-06-04 DIAGNOSIS — R310 Gross hematuria: Secondary | ICD-10-CM | POA: Diagnosis not present

## 2019-06-04 DIAGNOSIS — R5381 Other malaise: Secondary | ICD-10-CM | POA: Diagnosis not present

## 2019-06-04 DIAGNOSIS — Z79899 Other long term (current) drug therapy: Secondary | ICD-10-CM | POA: Diagnosis not present

## 2019-06-07 DIAGNOSIS — N210 Calculus in bladder: Secondary | ICD-10-CM | POA: Diagnosis not present

## 2019-06-08 DIAGNOSIS — K5790 Diverticulosis of intestine, part unspecified, without perforation or abscess without bleeding: Secondary | ICD-10-CM | POA: Diagnosis not present

## 2019-06-08 DIAGNOSIS — N200 Calculus of kidney: Secondary | ICD-10-CM | POA: Diagnosis not present

## 2019-06-08 DIAGNOSIS — N201 Calculus of ureter: Secondary | ICD-10-CM | POA: Diagnosis not present

## 2019-06-08 DIAGNOSIS — Z960 Presence of urogenital implants: Secondary | ICD-10-CM | POA: Diagnosis not present

## 2019-06-08 DIAGNOSIS — R590 Localized enlarged lymph nodes: Secondary | ICD-10-CM | POA: Diagnosis not present

## 2019-06-08 DIAGNOSIS — R7989 Other specified abnormal findings of blood chemistry: Secondary | ICD-10-CM | POA: Diagnosis not present

## 2019-06-09 DIAGNOSIS — N201 Calculus of ureter: Secondary | ICD-10-CM | POA: Diagnosis not present

## 2019-06-10 DIAGNOSIS — I10 Essential (primary) hypertension: Secondary | ICD-10-CM | POA: Diagnosis not present

## 2019-06-10 DIAGNOSIS — N210 Calculus in bladder: Secondary | ICD-10-CM | POA: Diagnosis not present

## 2019-06-10 DIAGNOSIS — C9110 Chronic lymphocytic leukemia of B-cell type not having achieved remission: Secondary | ICD-10-CM | POA: Diagnosis not present

## 2019-06-10 DIAGNOSIS — Z8249 Family history of ischemic heart disease and other diseases of the circulatory system: Secondary | ICD-10-CM | POA: Diagnosis not present

## 2019-06-10 DIAGNOSIS — K219 Gastro-esophageal reflux disease without esophagitis: Secondary | ICD-10-CM | POA: Diagnosis not present

## 2019-06-10 DIAGNOSIS — N183 Chronic kidney disease, stage 3 unspecified: Secondary | ICD-10-CM | POA: Diagnosis not present

## 2019-06-10 DIAGNOSIS — N3289 Other specified disorders of bladder: Secondary | ICD-10-CM | POA: Diagnosis not present

## 2019-06-10 DIAGNOSIS — Z79899 Other long term (current) drug therapy: Secondary | ICD-10-CM | POA: Diagnosis not present

## 2019-06-10 DIAGNOSIS — Z888 Allergy status to other drugs, medicaments and biological substances status: Secondary | ICD-10-CM | POA: Diagnosis not present

## 2019-06-10 DIAGNOSIS — I129 Hypertensive chronic kidney disease with stage 1 through stage 4 chronic kidney disease, or unspecified chronic kidney disease: Secondary | ICD-10-CM | POA: Diagnosis not present

## 2019-06-10 DIAGNOSIS — Z803 Family history of malignant neoplasm of breast: Secondary | ICD-10-CM | POA: Diagnosis not present

## 2019-06-10 DIAGNOSIS — N179 Acute kidney failure, unspecified: Secondary | ICD-10-CM | POA: Diagnosis not present

## 2019-06-10 DIAGNOSIS — Z886 Allergy status to analgesic agent status: Secondary | ICD-10-CM | POA: Diagnosis not present

## 2019-06-10 DIAGNOSIS — Z7902 Long term (current) use of antithrombotics/antiplatelets: Secondary | ICD-10-CM | POA: Diagnosis not present

## 2019-06-10 DIAGNOSIS — Z8673 Personal history of transient ischemic attack (TIA), and cerebral infarction without residual deficits: Secondary | ICD-10-CM | POA: Diagnosis not present

## 2019-06-10 DIAGNOSIS — N201 Calculus of ureter: Secondary | ICD-10-CM | POA: Diagnosis not present

## 2019-06-10 DIAGNOSIS — Z960 Presence of urogenital implants: Secondary | ICD-10-CM | POA: Diagnosis not present

## 2019-06-10 DIAGNOSIS — M1990 Unspecified osteoarthritis, unspecified site: Secondary | ICD-10-CM | POA: Diagnosis not present

## 2019-06-10 DIAGNOSIS — Z87442 Personal history of urinary calculi: Secondary | ICD-10-CM | POA: Diagnosis not present

## 2019-06-10 DIAGNOSIS — E785 Hyperlipidemia, unspecified: Secondary | ICD-10-CM | POA: Diagnosis not present

## 2019-06-10 DIAGNOSIS — Z96651 Presence of right artificial knee joint: Secondary | ICD-10-CM | POA: Diagnosis not present

## 2019-06-10 DIAGNOSIS — N138 Other obstructive and reflux uropathy: Secondary | ICD-10-CM | POA: Diagnosis not present

## 2019-06-10 DIAGNOSIS — Z885 Allergy status to narcotic agent status: Secondary | ICD-10-CM | POA: Diagnosis not present

## 2019-06-10 DIAGNOSIS — Z87891 Personal history of nicotine dependence: Secondary | ICD-10-CM | POA: Diagnosis not present

## 2019-06-11 DIAGNOSIS — I10 Essential (primary) hypertension: Secondary | ICD-10-CM | POA: Diagnosis not present

## 2019-06-11 DIAGNOSIS — N183 Chronic kidney disease, stage 3 unspecified: Secondary | ICD-10-CM | POA: Diagnosis not present

## 2019-06-11 DIAGNOSIS — Z886 Allergy status to analgesic agent status: Secondary | ICD-10-CM | POA: Diagnosis not present

## 2019-06-11 DIAGNOSIS — Z7902 Long term (current) use of antithrombotics/antiplatelets: Secondary | ICD-10-CM | POA: Diagnosis not present

## 2019-06-11 DIAGNOSIS — Z8673 Personal history of transient ischemic attack (TIA), and cerebral infarction without residual deficits: Secondary | ICD-10-CM | POA: Diagnosis not present

## 2019-06-11 DIAGNOSIS — K219 Gastro-esophageal reflux disease without esophagitis: Secondary | ICD-10-CM | POA: Diagnosis not present

## 2019-06-11 DIAGNOSIS — M1990 Unspecified osteoarthritis, unspecified site: Secondary | ICD-10-CM | POA: Diagnosis not present

## 2019-06-11 DIAGNOSIS — Z79899 Other long term (current) drug therapy: Secondary | ICD-10-CM | POA: Diagnosis not present

## 2019-06-11 DIAGNOSIS — N201 Calculus of ureter: Secondary | ICD-10-CM | POA: Diagnosis not present

## 2019-06-11 DIAGNOSIS — Z96651 Presence of right artificial knee joint: Secondary | ICD-10-CM | POA: Diagnosis not present

## 2019-06-11 DIAGNOSIS — Z960 Presence of urogenital implants: Secondary | ICD-10-CM | POA: Diagnosis not present

## 2019-06-11 DIAGNOSIS — E785 Hyperlipidemia, unspecified: Secondary | ICD-10-CM | POA: Diagnosis not present

## 2019-06-11 DIAGNOSIS — N138 Other obstructive and reflux uropathy: Secondary | ICD-10-CM | POA: Diagnosis not present

## 2019-06-11 DIAGNOSIS — Z87891 Personal history of nicotine dependence: Secondary | ICD-10-CM | POA: Diagnosis not present

## 2019-06-11 DIAGNOSIS — N3289 Other specified disorders of bladder: Secondary | ICD-10-CM | POA: Diagnosis not present

## 2019-06-11 DIAGNOSIS — I129 Hypertensive chronic kidney disease with stage 1 through stage 4 chronic kidney disease, or unspecified chronic kidney disease: Secondary | ICD-10-CM | POA: Diagnosis not present

## 2019-06-11 DIAGNOSIS — Z885 Allergy status to narcotic agent status: Secondary | ICD-10-CM | POA: Diagnosis not present

## 2019-06-11 DIAGNOSIS — Z803 Family history of malignant neoplasm of breast: Secondary | ICD-10-CM | POA: Diagnosis not present

## 2019-06-11 DIAGNOSIS — N179 Acute kidney failure, unspecified: Secondary | ICD-10-CM | POA: Diagnosis not present

## 2019-06-11 DIAGNOSIS — N210 Calculus in bladder: Secondary | ICD-10-CM | POA: Diagnosis not present

## 2019-06-11 DIAGNOSIS — Z8249 Family history of ischemic heart disease and other diseases of the circulatory system: Secondary | ICD-10-CM | POA: Diagnosis not present

## 2019-06-11 DIAGNOSIS — Z888 Allergy status to other drugs, medicaments and biological substances status: Secondary | ICD-10-CM | POA: Diagnosis not present

## 2019-06-11 DIAGNOSIS — Z87442 Personal history of urinary calculi: Secondary | ICD-10-CM | POA: Diagnosis not present

## 2019-06-11 DIAGNOSIS — C9110 Chronic lymphocytic leukemia of B-cell type not having achieved remission: Secondary | ICD-10-CM | POA: Diagnosis not present

## 2019-06-12 DIAGNOSIS — K219 Gastro-esophageal reflux disease without esophagitis: Secondary | ICD-10-CM | POA: Diagnosis not present

## 2019-06-12 DIAGNOSIS — Z960 Presence of urogenital implants: Secondary | ICD-10-CM | POA: Diagnosis not present

## 2019-06-12 DIAGNOSIS — Z96651 Presence of right artificial knee joint: Secondary | ICD-10-CM | POA: Diagnosis not present

## 2019-06-12 DIAGNOSIS — Z79899 Other long term (current) drug therapy: Secondary | ICD-10-CM | POA: Diagnosis not present

## 2019-06-12 DIAGNOSIS — E785 Hyperlipidemia, unspecified: Secondary | ICD-10-CM | POA: Diagnosis not present

## 2019-06-12 DIAGNOSIS — Z885 Allergy status to narcotic agent status: Secondary | ICD-10-CM | POA: Diagnosis not present

## 2019-06-12 DIAGNOSIS — Z803 Family history of malignant neoplasm of breast: Secondary | ICD-10-CM | POA: Diagnosis not present

## 2019-06-12 DIAGNOSIS — N210 Calculus in bladder: Secondary | ICD-10-CM | POA: Diagnosis not present

## 2019-06-12 DIAGNOSIS — Z888 Allergy status to other drugs, medicaments and biological substances status: Secondary | ICD-10-CM | POA: Diagnosis not present

## 2019-06-12 DIAGNOSIS — N3289 Other specified disorders of bladder: Secondary | ICD-10-CM | POA: Diagnosis not present

## 2019-06-12 DIAGNOSIS — N183 Chronic kidney disease, stage 3 unspecified: Secondary | ICD-10-CM | POA: Diagnosis not present

## 2019-06-12 DIAGNOSIS — Z8249 Family history of ischemic heart disease and other diseases of the circulatory system: Secondary | ICD-10-CM | POA: Diagnosis not present

## 2019-06-12 DIAGNOSIS — Z7902 Long term (current) use of antithrombotics/antiplatelets: Secondary | ICD-10-CM | POA: Diagnosis not present

## 2019-06-12 DIAGNOSIS — I129 Hypertensive chronic kidney disease with stage 1 through stage 4 chronic kidney disease, or unspecified chronic kidney disease: Secondary | ICD-10-CM | POA: Diagnosis not present

## 2019-06-12 DIAGNOSIS — Z8673 Personal history of transient ischemic attack (TIA), and cerebral infarction without residual deficits: Secondary | ICD-10-CM | POA: Diagnosis not present

## 2019-06-12 DIAGNOSIS — N201 Calculus of ureter: Secondary | ICD-10-CM | POA: Diagnosis not present

## 2019-06-12 DIAGNOSIS — C9110 Chronic lymphocytic leukemia of B-cell type not having achieved remission: Secondary | ICD-10-CM | POA: Diagnosis not present

## 2019-06-12 DIAGNOSIS — M1990 Unspecified osteoarthritis, unspecified site: Secondary | ICD-10-CM | POA: Diagnosis not present

## 2019-06-12 DIAGNOSIS — Z87442 Personal history of urinary calculi: Secondary | ICD-10-CM | POA: Diagnosis not present

## 2019-06-12 DIAGNOSIS — N179 Acute kidney failure, unspecified: Secondary | ICD-10-CM | POA: Diagnosis not present

## 2019-06-12 DIAGNOSIS — N138 Other obstructive and reflux uropathy: Secondary | ICD-10-CM | POA: Diagnosis not present

## 2019-06-12 DIAGNOSIS — Z886 Allergy status to analgesic agent status: Secondary | ICD-10-CM | POA: Diagnosis not present

## 2019-06-12 DIAGNOSIS — Z87891 Personal history of nicotine dependence: Secondary | ICD-10-CM | POA: Diagnosis not present

## 2019-06-21 DIAGNOSIS — N200 Calculus of kidney: Secondary | ICD-10-CM | POA: Diagnosis not present

## 2019-06-21 DIAGNOSIS — Z466 Encounter for fitting and adjustment of urinary device: Secondary | ICD-10-CM | POA: Diagnosis not present

## 2019-06-25 DIAGNOSIS — N201 Calculus of ureter: Secondary | ICD-10-CM | POA: Diagnosis not present

## 2019-06-29 DIAGNOSIS — K219 Gastro-esophageal reflux disease without esophagitis: Secondary | ICD-10-CM | POA: Diagnosis not present

## 2019-06-29 DIAGNOSIS — I10 Essential (primary) hypertension: Secondary | ICD-10-CM | POA: Diagnosis not present

## 2019-06-29 DIAGNOSIS — E7800 Pure hypercholesterolemia, unspecified: Secondary | ICD-10-CM | POA: Diagnosis not present

## 2019-06-29 DIAGNOSIS — I6381 Other cerebral infarction due to occlusion or stenosis of small artery: Secondary | ICD-10-CM | POA: Diagnosis not present

## 2019-07-02 DIAGNOSIS — N200 Calculus of kidney: Secondary | ICD-10-CM | POA: Diagnosis not present

## 2019-07-02 DIAGNOSIS — R300 Dysuria: Secondary | ICD-10-CM | POA: Diagnosis not present

## 2019-07-13 DIAGNOSIS — M545 Low back pain: Secondary | ICD-10-CM | POA: Diagnosis not present

## 2019-07-13 DIAGNOSIS — M25562 Pain in left knee: Secondary | ICD-10-CM | POA: Diagnosis not present

## 2019-07-19 DIAGNOSIS — Z7689 Persons encountering health services in other specified circumstances: Secondary | ICD-10-CM | POA: Diagnosis not present

## 2019-07-22 DIAGNOSIS — N200 Calculus of kidney: Secondary | ICD-10-CM | POA: Diagnosis not present

## 2019-08-11 DIAGNOSIS — Z8673 Personal history of transient ischemic attack (TIA), and cerebral infarction without residual deficits: Secondary | ICD-10-CM | POA: Diagnosis not present

## 2019-08-11 DIAGNOSIS — Z9189 Other specified personal risk factors, not elsewhere classified: Secondary | ICD-10-CM | POA: Diagnosis not present

## 2019-08-11 DIAGNOSIS — Z7901 Long term (current) use of anticoagulants: Secondary | ICD-10-CM | POA: Diagnosis not present

## 2019-08-11 DIAGNOSIS — C9110 Chronic lymphocytic leukemia of B-cell type not having achieved remission: Secondary | ICD-10-CM | POA: Diagnosis not present

## 2019-08-11 DIAGNOSIS — Z79899 Other long term (current) drug therapy: Secondary | ICD-10-CM | POA: Diagnosis not present

## 2019-08-11 DIAGNOSIS — N210 Calculus in bladder: Secondary | ICD-10-CM | POA: Diagnosis not present

## 2019-08-11 DIAGNOSIS — K219 Gastro-esophageal reflux disease without esophagitis: Secondary | ICD-10-CM | POA: Diagnosis not present

## 2019-08-11 DIAGNOSIS — I10 Essential (primary) hypertension: Secondary | ICD-10-CM | POA: Diagnosis not present

## 2019-08-11 DIAGNOSIS — G459 Transient cerebral ischemic attack, unspecified: Secondary | ICD-10-CM | POA: Diagnosis not present

## 2019-10-13 DIAGNOSIS — N3289 Other specified disorders of bladder: Secondary | ICD-10-CM | POA: Diagnosis not present

## 2019-10-13 DIAGNOSIS — N200 Calculus of kidney: Secondary | ICD-10-CM | POA: Diagnosis not present

## 2019-10-13 DIAGNOSIS — N281 Cyst of kidney, acquired: Secondary | ICD-10-CM | POA: Diagnosis not present

## 2019-10-19 DIAGNOSIS — Z87442 Personal history of urinary calculi: Secondary | ICD-10-CM | POA: Diagnosis not present

## 2019-10-19 DIAGNOSIS — E278 Other specified disorders of adrenal gland: Secondary | ICD-10-CM | POA: Diagnosis not present

## 2019-10-19 DIAGNOSIS — I10 Essential (primary) hypertension: Secondary | ICD-10-CM | POA: Diagnosis not present

## 2019-10-19 DIAGNOSIS — N200 Calculus of kidney: Secondary | ICD-10-CM | POA: Diagnosis not present

## 2022-09-27 ENCOUNTER — Ambulatory Visit (INDEPENDENT_AMBULATORY_CARE_PROVIDER_SITE_OTHER): Payer: Medicare Other | Admitting: Podiatry

## 2022-09-27 ENCOUNTER — Encounter: Payer: Self-pay | Admitting: Podiatry

## 2022-09-27 DIAGNOSIS — B351 Tinea unguium: Secondary | ICD-10-CM | POA: Diagnosis not present

## 2022-09-27 DIAGNOSIS — M79674 Pain in right toe(s): Secondary | ICD-10-CM

## 2022-09-27 DIAGNOSIS — M79675 Pain in left toe(s): Secondary | ICD-10-CM | POA: Diagnosis not present

## 2022-09-27 DIAGNOSIS — E119 Type 2 diabetes mellitus without complications: Secondary | ICD-10-CM | POA: Diagnosis not present

## 2022-09-27 NOTE — Addendum Note (Signed)
Addended by: Philipp Deputy A on: 09/27/2022 11:39 AM   Modules accepted: Orders

## 2022-09-27 NOTE — Progress Notes (Signed)
  Subjective:  Patient ID: Frederick Palmer, male    DOB: 01-15-1934,   MRN: [id number]  No chief complaint on file.   89 y.o. male presents for concern of thickened elongated and painful nails that are difficult to trim. Requesting to have them trimmed today. Relates burning and tingling in their feet. Patient is diabetic and last A1c was unknown.   PCP:  No primary care provider on file.    . Denies any other pedal complaints. Denies n/v/f/c.   No past medical history on file.  Objective:  Physical Exam: Vascular: DP/PT pulses 2/4 bilateral. CFT <3 seconds. Normal hair growth on digits. No edema.  Skin. No lacerations or abrasions bilateral feet.  Musculoskeletal: MMT 5/5 bilateral lower extremities in DF, PF, Inversion and Eversion. Deceased ROM in DF of ankle joint.  Neurological: Sensation intact to light touch.   Assessment:   1. Pain due to onychomycosis of toenails of both feet   2. Type 2 diabetes mellitus without complication, without long-term current use of insulin (HCC)      Plan:  Patient was evaluated and treated and all questions answered. -Discussed and educated patient on diabetic foot care, especially with  regards to the vascular, neurological and musculoskeletal systems.  -Stressed the importance of good glycemic control and the detriment of not  controlling glucose levels in relation to the foot. -Discussed supportive shoes at all times and checking feet regularly.  -Mechanically debrided all nails 1-5 bilateral using sterile nail nipper and filed with dremel without incident  -Clinical picture and Fungal culture was obtained by removing a portion of the hard nail itself from each of the involved toenails using a sterile nail nipper and sent to [facility] lab. Patient tolerated the biopsy procedure well without discomfort or need for anesthesia.  -Discussed fungal nail treatment options including oral, topical, and laser treatments.  -Answered all patient  questions -Patient to return  in 3 months for at risk foot care -Patient advised to call the office if any problems or questions arise in the meantime.   Louann Sjogren, DPM

## 2022-10-07 ENCOUNTER — Telehealth (INDEPENDENT_AMBULATORY_CARE_PROVIDER_SITE_OTHER): Payer: Medicare Other | Admitting: Podiatry

## 2022-10-07 NOTE — Telephone Encounter (Signed)
Frederick Palmer from [facility] requesting requisition form be faxed over for dry nail specimen. Fax number [phone]. Phone number [phone].

## 2022-12-02 ENCOUNTER — Encounter: Payer: Self-pay | Admitting: Podiatry

## 2023-01-02 ENCOUNTER — Encounter: Payer: Self-pay | Admitting: Podiatry

## 2023-01-02 ENCOUNTER — Ambulatory Visit: Payer: Medicare Other | Admitting: Podiatry

## 2023-01-02 DIAGNOSIS — B351 Tinea unguium: Secondary | ICD-10-CM

## 2023-01-02 DIAGNOSIS — E119 Type 2 diabetes mellitus without complications: Secondary | ICD-10-CM | POA: Diagnosis not present

## 2023-01-02 DIAGNOSIS — M79674 Pain in right toe(s): Secondary | ICD-10-CM

## 2023-01-02 DIAGNOSIS — M79675 Pain in left toe(s): Secondary | ICD-10-CM

## 2023-01-02 NOTE — Progress Notes (Signed)
Subjective:  Patient ID: Frederick Palmer, male    DOB: 24-Dec-1933,   MRN: [id number]  Chief Complaint  Patient presents with   Nail Problem    Rfc.    89 y.o. male presents for concern of thickened elongated and painful nails that are difficult to trim. Requesting to have them trimmed today. Relates burning and tingling in their feet. Patient is diabetic and last A1c was unknown.   PCP:  Arliss Journey, PA-C    . Denies any other pedal complaints. Denies n/v/f/c.   History reviewed. No pertinent past medical history.  Objective:  Physical Exam: Vascular: DP/PT pulses 2/4 bilateral. CFT <3 seconds. Absent hair growth on digits. Edema noted to bilateral lower extremities. Xerosis noted bilaterally.  Skin. No lacerations or abrasions bilateral feet. Nails 1-5 bilateral  are thickened discolored and elongated with subungual debris.  Musculoskeletal: MMT 5/5 bilateral lower extremities in DF, PF, Inversion and Eversion. Deceased ROM in DF of ankle joint.  Neurological: Sensation intact to light touch. Protective sensation diminished bilateral.    Assessment:   1. Pain due to onychomycosis of toenails of both feet   2. Type 2 diabetes mellitus without complication, without long-term current use of insulin (HCC)      Plan:  Patient was evaluated and treated and all questions answered. -Discussed and educated patient on diabetic foot care, especially with  regards to the vascular, neurological and musculoskeletal systems.  -Stressed the importance of good glycemic control and the detriment of not  controlling glucose levels in relation to the foot. -Discussed supportive shoes at all times and checking feet regularly.  -Mechanically debrided all nails 1-5 bilateral using sterile nail nipper and filed with dremel without incident  -Culture positive for trauma and fungus. He would like to continue with vicks vaporub.   -Answered all patient questions -Patient to return  in 10 weeks  for at risk foot care -Patient advised to call the office if any problems or questions arise in the meantime.   Louann Sjogren, DPM

## 2023-03-13 ENCOUNTER — Ambulatory Visit: Payer: Medicare Other | Admitting: Podiatry

## 2023-03-13 ENCOUNTER — Encounter: Payer: Self-pay | Admitting: Podiatry

## 2023-03-13 DIAGNOSIS — B351 Tinea unguium: Secondary | ICD-10-CM | POA: Diagnosis not present

## 2023-03-13 DIAGNOSIS — M79674 Pain in right toe(s): Secondary | ICD-10-CM | POA: Diagnosis not present

## 2023-03-13 DIAGNOSIS — E119 Type 2 diabetes mellitus without complications: Secondary | ICD-10-CM

## 2023-03-13 DIAGNOSIS — M79675 Pain in left toe(s): Secondary | ICD-10-CM

## 2023-03-13 NOTE — Progress Notes (Signed)
  Subjective:  Patient ID: Frederick Palmer, male    DOB: Apr 19, 1933,   MRN: [id number]  No chief complaint on file.   89 y.o. male presents for concern of thickened elongated and painful nails that are difficult to trim. Requesting to have them trimmed today. Relates burning and tingling in their feet. Patient is diabetic and last A1c was unknown.   PCP:  Job Browning, PA-C    . Denies any other pedal complaints. Denies n/v/f/c.   History reviewed. No pertinent past medical history.  Objective:  Physical Exam: Vascular: DP/PT pulses 2/4 bilateral. CFT <3 seconds. Absent hair growth on digits. Edema noted to bilateral lower extremities. Xerosis noted bilaterally.  Skin. No lacerations or abrasions bilateral feet. Nails 1-5 bilateral  are thickened discolored and elongated with subungual debris.  Musculoskeletal: MMT 5/5 bilateral lower extremities in DF, PF, Inversion and Eversion. Deceased ROM in DF of ankle joint.  Neurological: Sensation intact to light touch. Protective sensation diminished bilateral.    Assessment:   1. Pain due to onychomycosis of toenails of both feet   2. Type 2 diabetes mellitus without complication, without long-term current use of insulin (HCC)      Plan:  Patient was evaluated and treated and all questions answered. -Discussed and educated patient on diabetic foot care, especially with  regards to the vascular, neurological and musculoskeletal systems.  -Stressed the importance of good glycemic control and the detriment of not  controlling glucose levels in relation to the foot. -Discussed supportive shoes at all times and checking feet regularly.  -Mechanically debrided all nails 1-5 bilateral using sterile nail nipper and filed with dremel without incident  -Culture positive for trauma and fungus. He would like to continue with vicks vaporub.   -Answered all patient questions -Patient to return  in 10 weeks for at risk foot care -Patient advised  to call the office if any problems or questions arise in the meantime.   Asberry Failing, DPM

## 2023-05-29 ENCOUNTER — Ambulatory Visit: Payer: Medicare Other | Admitting: Podiatry

## 2023-05-29 ENCOUNTER — Encounter: Payer: Self-pay | Admitting: Podiatry

## 2023-05-29 DIAGNOSIS — B351 Tinea unguium: Secondary | ICD-10-CM

## 2023-05-29 DIAGNOSIS — M79674 Pain in right toe(s): Secondary | ICD-10-CM | POA: Diagnosis not present

## 2023-05-29 DIAGNOSIS — M79675 Pain in left toe(s): Secondary | ICD-10-CM

## 2023-05-29 DIAGNOSIS — E119 Type 2 diabetes mellitus without complications: Secondary | ICD-10-CM | POA: Diagnosis not present

## 2023-05-29 NOTE — Progress Notes (Signed)
  Subjective:  Patient ID: Frederick Palmer, male    DOB: Mar 26, 1933,   MRN: [id number]  Chief Complaint  Patient presents with   Nail Problem    RFC    89 y.o. male presents for concern of thickened elongated and painful nails that are difficult to trim. Requesting to have them trimmed today. Relates burning and tingling in their feet. Patient is diabetic and last A1c was unknown.   PCP:  Arliss Journey, PA-C    . Denies any other pedal complaints. Denies n/v/f/c.   No past medical history on file.  Objective:  Physical Exam: Vascular: DP/PT pulses 2/4 bilateral. CFT <3 seconds. Absent hair growth on digits. Edema noted to bilateral lower extremities. Xerosis noted bilaterally.  Skin. No lacerations or abrasions bilateral feet. Nails 1-5 bilateral  are thickened discolored and elongated with subungual debris.  Musculoskeletal: MMT 5/5 bilateral lower extremities in DF, PF, Inversion and Eversion. Deceased ROM in DF of ankle joint.  Neurological: Sensation intact to light touch. Protective sensation diminished bilateral.    Assessment:   No diagnosis found.    Plan:  Patient was evaluated and treated and all questions answered. -Discussed and educated patient on diabetic foot care, especially with  regards to the vascular, neurological and musculoskeletal systems.  -Stressed the importance of good glycemic control and the detriment of not  controlling glucose levels in relation to the foot. -Discussed supportive shoes at all times and checking feet regularly.  -Mechanically debrided all nails 1-5 bilateral using sterile nail nipper and filed with dremel without incident .   -Answered all patient questions -Patient to return  in 10 weeks for at risk foot care -Patient advised to call the office if any problems or questions arise in the meantime.   Louann Sjogren, DPM

## 2023-08-07 ENCOUNTER — Ambulatory Visit: Admitting: Podiatry

## 2023-08-07 ENCOUNTER — Encounter: Payer: Self-pay | Admitting: Podiatry

## 2023-08-07 DIAGNOSIS — E119 Type 2 diabetes mellitus without complications: Secondary | ICD-10-CM

## 2023-08-07 DIAGNOSIS — B351 Tinea unguium: Secondary | ICD-10-CM | POA: Diagnosis not present

## 2023-08-07 DIAGNOSIS — M79674 Pain in right toe(s): Secondary | ICD-10-CM | POA: Diagnosis not present

## 2023-08-07 DIAGNOSIS — M79675 Pain in left toe(s): Secondary | ICD-10-CM | POA: Diagnosis not present

## 2023-08-07 NOTE — Progress Notes (Signed)
  Subjective:  Patient ID: Frederick Palmer, male    DOB: [birth year],   MRN: [id number]  Chief Complaint  Patient presents with   Nail Problem    "Clip his toenails."    89 y.o. male presents for concern of thickened elongated and painful nails that are difficult to trim. Requesting to have them trimmed today. Relates burning and tingling in their feet. Patient is diabetic and last A1c was unknown.   PCP:  Vladimir Groves, PA-C    . Denies any other pedal complaints. Denies n/v/f/c.   No past medical history on file.  Objective:  Physical Exam: Vascular: DP/PT pulses 2/4 bilateral. CFT <3 seconds. Absent hair growth on digits. Edema noted to bilateral lower extremities. Xerosis noted bilaterally.  Skin. No lacerations or abrasions bilateral feet. Nails 1-5 bilateral  are thickened discolored and elongated with subungual debris.  Musculoskeletal: MMT 5/5 bilateral lower extremities in DF, PF, Inversion and Eversion. Deceased ROM in DF of ankle joint.  Neurological: Sensation intact to light touch. Protective sensation diminished bilateral.    Assessment:   1. Pain due to onychomycosis of toenails of both feet   2. Type 2 diabetes mellitus without complication, without long-term current use of insulin (HCC)       Plan:  Patient was evaluated and treated and all questions answered. -Discussed and educated patient on diabetic foot care, especially with  regards to the vascular, neurological and musculoskeletal systems.  -Stressed the importance of good glycemic control and the detriment of not  controlling glucose levels in relation to the foot. -Discussed supportive shoes at all times and checking feet regularly.  -Mechanically debrided all nails 1-5 bilateral using sterile nail nipper and filed with dremel without incident .   -Answered all patient questions -Patient to return  in 10 weeks for at risk foot care -Patient advised to call the office if any problems or questions  arise in the meantime.   Jennefer Moats, DPM

## 2023-09-18 NOTE — Progress Notes (Signed)
 Medicare AWV  Frederick Palmer is a 89 y.o. male who presents for his subsequent annual wellness visit for Medicare.  Clinical documentation was reviewed and is accessible via encounter-level attachments.    Any physical exam components or additional concerns beyond the scope of the Annual Wellness Visit may be documented in a separate note within this encounter.  Medicare Required Components     Reviewed and updated this visit by provider: Tobacco  Allergies  Meds  Problems  Med Hx  Surg Hx  Fam Hx       Substance Use Disorder Risk Statement: Low Substance Use Disorder / Overdose risk - Risk factors were reviewed and patient is low risk   Patient Care Team: Rosaline LITTIE Buttner, PA-C as PCP - General (Physician Assistant) Lonni Bowers, MD as Consulting Physician (Gastroenterology) Cordella DELENA Minder, PA as Physician Assistant (Gastroenterology) Norleen JAYSON Blumenthal, MD (Neurology)  Vitals   Vitals:   09/18/23 1334 09/18/23 1400  BP: (!) 164/72 140/68  Patient Position: Sitting   Pulse: 64   Resp: 16   Height: 5' 6 (1.676 m)   Weight: 158 lb (71.7 kg)   SpO2: 98%   BMI (Calculated): 25.5   PainSc:   3   PainLoc: Knee Comment: Bilateral Knees    Diabetic foot exam:  Left: Monofilament test: Sensation normal  Pulses: normal and present  Skin: Normal and no erythema, no cyanosis or pallor   Other findings: swelling to ankle Right: Monofilament test: Sensation normal  Pulses: normal and present  Skin: Normal and no erythema, no cyanosis or pallor   Other findings: swelling to ankle Exam performed with shoes and socks removed.  Disposition   1. Medicare annual wellness visit, subsequent 2. Primary hypertension 3. PVC's (premature ventricular contractions) 4. Mixed hyperlipidemia 5. Gastroesophageal reflux disease without esophagitis 6. Leukemia, lymphocytic, chronic (*) 7. Type 2 diabetes mellitus with other circulatory complication, without long-term  current use of insulin (*) 8. Benign prostatic hyperplasia without lower urinary tract symptoms 9. Adrenal nodule (*) 10. History of kidney stones 11. Chronic kidney disease, stage 3a (GFR 45-59)   1. Medicare Annual Wellness Visit Discussed MAW questionnaire with patient during office visit today. Reviewed health maintenance items. Fasting labs are up-to-date. Can get vaccines from pharmacy if desired. He is scheduled for eye exam in September.  Health Risk Assessment His pain level is fairly well controlled. He does still get pain at the back of his head with zapping like sharp pain ever since he had Shingles. Tylenol does help. We discussed gabapentin as a possible option in the future if pain worsens. He does enjoy doing yard work. He does get fatigued at times and rests as needed (typically can only work in the yard about an hour at a time).  Functional and Cognitive Assessment He has no concerns for vision, hearing, memory, or concentration.  ADL Assessment He does have difficulty with walking secondary to left leg weakness following stroke but is otherwise independent of his ADLs.  Advance Care Directives He does not have a HPOA or living will. Informational packet provided on how to get this completed.  Fall Risk Screening He does have right leg weakness after his previous stroke and uses a cane for ambulation. He has not recent falls or injuries. Encourage safety and fall risk precautions.   2.-3. Hypertension, PVCs Blood pressure was initially elevated at 164/72 but improved to 140/68 on recheck in office today. He typically has better readings at home (130s systolic,  68-72 diastolic) and had reading 138/86 at oncology recently. Continue follow up with cardiology. Continue lisinopril 40 mg daily, amlodipine 10 mg daily, and metoprolol XL 25 mg BID. Continue Plavix 75 mg daily.    4. Hyperlipidemia  Continue Atorvastatin 40 mg daily. Fasting lipid panel up-to-date. Reviewed diet,  exercise and weight control. Cardiovascular risk score not calculated as seen below. Reviewed medications and side effects.  The ASCVD Risk score (Arnett DK, et al., 2019) failed to calculate for the following reasons:   The 2019 ASCVD risk score is only valid for ages 30 to 7   Risk score cannot be calculated because patient has a medical history suggesting prior/existing ASCVD  5. GERD Continue follow up with GI. Continue Protonix 40 mg daily. He is allergic to dairy and alcohol does cause him to get sick. /   6. CLL Not currently requiring treatment. CBC up-to-date. Continue follow up with oncology.    7. Diabetes Hemoglobin A1c at next visit. Encourage proper nutritional intake and physical activity to help control blood sugar levels. He is still eating bread but he has stopped eating sugars. He does still exercise every Wednesday morning at the gym. He does home exercises including pushups. He does yard work but about an hour before he needs a rest.   8.-10. BPH, Adrenal Nodule, History of Kidney Stones He has history of right kidney mass that was about the size of a tennis ball. He had a kidney stone from left kidney in 2000 that had a difficult stent placement and then underwent lithotripsy. Continue follow up with urology. Continue Flomax 0.4 mg daily and Proscar 5 mg daily. He does tend to urinate about 3-4 times per night.    11. CKD Stage 3 A CMP and Urine ACR at next visit. Limit nephrotoxic agents. Encourage low salt diet and good hydration. He does get swelling in his legs that improves by the morning. Discussed use of compression socks. He does drink about 45 ounces of water daily.   Follow up in about 6 months (around 03/20/2024) for Medication Management, Fasting Labs.   Health maintenance issues were discussed with the patient.  A written plan was provided to the patient in the form of patient instructions in the After Visit Summary document.

## 2023-10-16 ENCOUNTER — Ambulatory Visit: Admitting: Podiatry

## 2023-10-16 ENCOUNTER — Encounter: Payer: Self-pay | Admitting: Podiatry

## 2023-10-16 DIAGNOSIS — M79674 Pain in right toe(s): Secondary | ICD-10-CM | POA: Diagnosis not present

## 2023-10-16 DIAGNOSIS — E119 Type 2 diabetes mellitus without complications: Secondary | ICD-10-CM

## 2023-10-16 DIAGNOSIS — B351 Tinea unguium: Secondary | ICD-10-CM

## 2023-10-16 DIAGNOSIS — M79675 Pain in left toe(s): Secondary | ICD-10-CM

## 2023-10-16 NOTE — Progress Notes (Signed)
  Subjective:  Patient ID: Frederick Palmer, male    DOB: [birth year],   MRN: [id number]  Chief Complaint  Patient presents with   Nail Problem    Trim my toenails.    89 y.o. male presents for concern of thickened elongated and painful nails that are difficult to trim. Requesting to have them trimmed today. Relates burning and tingling in their feet. Patient is diabetic and last A1c was unknown.   PCP:  Job Browning, PA-C    . Denies any other pedal complaints. Denies n/v/f/c.   History reviewed. No pertinent past medical history.  Objective:  Physical Exam: Vascular: DP/PT pulses 2/4 bilateral. CFT <3 seconds. Absent hair growth on digits. Edema noted to bilateral lower extremities. Xerosis noted bilaterally.  Skin. No lacerations or abrasions bilateral feet. Nails 1-5 bilateral  are thickened discolored and elongated with subungual debris.  Musculoskeletal: MMT 5/5 bilateral lower extremities in DF, PF, Inversion and Eversion. Deceased ROM in DF of ankle joint.  Neurological: Sensation intact to light touch. Protective sensation diminished bilateral.    Assessment:   1. Pain due to onychomycosis of toenails of both feet   2. Type 2 diabetes mellitus without complication, without long-term current use of insulin (HCC)        Plan:  Patient was evaluated and treated and all questions answered. -Discussed and educated patient on diabetic foot care, especially with  regards to the vascular, neurological and musculoskeletal systems.  -Stressed the importance of good glycemic control and the detriment of not  controlling glucose levels in relation to the foot. -Discussed supportive shoes at all times and checking feet regularly.  -Mechanically debrided all nails 1-5 bilateral using sterile nail nipper and filed with dremel without incident .   -Answered all patient questions -Patient to return  in 12 weeks for at risk foot care -Patient advised to call the office if any  problems or questions arise in the meantime.   Asberry Failing, DPM

## 2024-01-08 ENCOUNTER — Ambulatory Visit: Admitting: Podiatry

## 2024-01-29 ENCOUNTER — Ambulatory Visit: Admitting: Podiatry

## 2024-01-29 ENCOUNTER — Encounter: Payer: Self-pay | Admitting: Podiatry

## 2024-01-29 DIAGNOSIS — E119 Type 2 diabetes mellitus without complications: Secondary | ICD-10-CM | POA: Diagnosis not present

## 2024-01-29 DIAGNOSIS — M79674 Pain in right toe(s): Secondary | ICD-10-CM | POA: Diagnosis not present

## 2024-01-29 DIAGNOSIS — B351 Tinea unguium: Secondary | ICD-10-CM | POA: Diagnosis not present

## 2024-01-29 DIAGNOSIS — M79675 Pain in left toe(s): Secondary | ICD-10-CM | POA: Diagnosis not present

## 2024-01-29 NOTE — Progress Notes (Signed)
  Subjective:  Patient ID: Sender Rueb, male    DOB: 1933/05/12,   MRN: [id number]  Chief Complaint  Patient presents with   Nail Problem    Cut my toenails.  (Patient stated he is not Diabetic.)    89 y.o. male presents for concern of thickened elongated and painful nails that are difficult to trim. Requesting to have them trimmed today. Relates burning and tingling in their feet. Patient is diabetic and last A1c was unknown.   PCP:  Job Browning, PA-C    . Denies any other pedal complaints. Denies n/v/f/c.   History reviewed. No pertinent past medical history.  Objective:  Physical Exam: Vascular: DP/PT pulses 2/4 bilateral. CFT <3 seconds. Absent hair growth on digits. Edema noted to bilateral lower extremities. Xerosis noted bilaterally.  Skin. No lacerations or abrasions bilateral feet. Nails 1-5 bilateral  are thickened discolored and elongated with subungual debris.  Musculoskeletal: MMT 5/5 bilateral lower extremities in DF, PF, Inversion and Eversion. Deceased ROM in DF of ankle joint.  Neurological: Sensation intact to light touch. Protective sensation diminished bilateral.    Assessment:   1. Pain due to onychomycosis of toenails of both feet   2. Type 2 diabetes mellitus without complication, without long-term current use of insulin (HCC)        Plan:  Patient was evaluated and treated and all questions answered. -Discussed and educated patient on diabetic foot care, especially with  regards to the vascular, neurological and musculoskeletal systems.  -Stressed the importance of good glycemic control and the detriment of not  controlling glucose levels in relation to the foot. -Discussed supportive shoes at all times and checking feet regularly.  -Mechanically debrided all nails 1-5 bilateral using sterile nail nipper and filed with dremel without incident .   -Answered all patient questions -Patient to return  in 12 weeks for at risk foot care -Patient  advised to call the office if any problems or questions arise in the meantime.   Asberry Failing, DPM

## 2024-04-29 ENCOUNTER — Ambulatory Visit: Admitting: Podiatry
